# Patient Record
Sex: Male | Born: 2000 | Race: White | Hispanic: No | Marital: Single | Smoking: Never smoker
Health system: Southern US, Community
[De-identification: ages and names within clinical notes are randomized; demographics above are authoritative.]

## PROBLEM LIST (undated history)

## (undated) DIAGNOSIS — J45909 Unspecified asthma, uncomplicated: Secondary | ICD-10-CM

---
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
## 2020-01-05 IMAGING — CT CT HEAD W/O CM
3 series · 16 of 45 positions shown, 19 images · non-contrast
Comparison: None.

CLINICAL DATA: 17 y/o M; fall, found on floor, laceration to
forehead.

EXAM:
CT HEAD WITHOUT CONTRAST
TECHNIQUE: Contiguous axial images were obtained from the base of the skull
through the vertex without intravenous contrast.

[Series 3: head wo · axial · 0.41mm/px · z∈[-38,+77]mm · 10 of 28 slices shown, 13 images]
[im 3/28  brain]
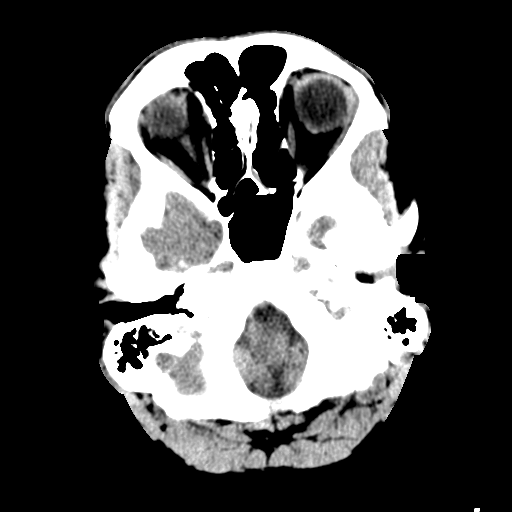
[im 3/28  bone]
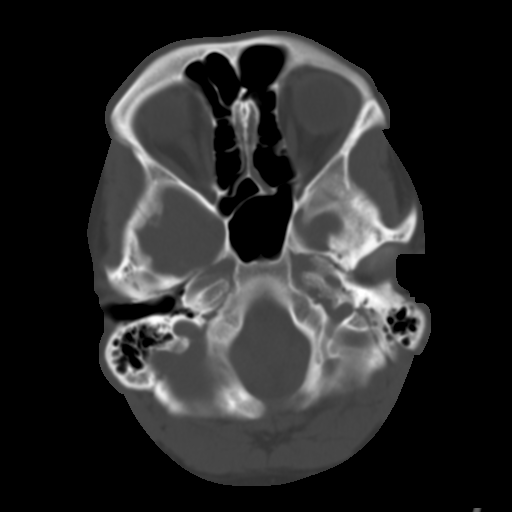
[im 5/28  brain]
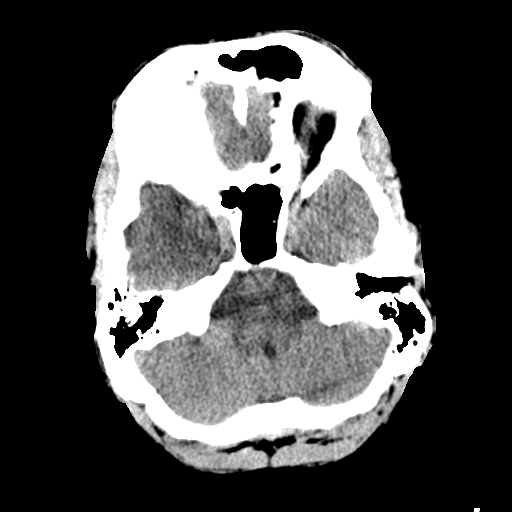
[im 8/28  brain]
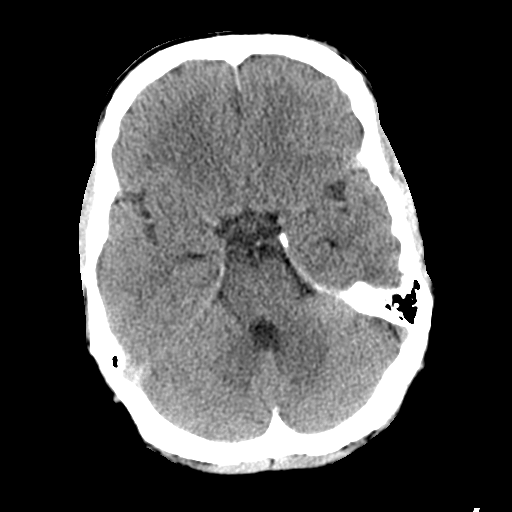
[im 11/28  brain]
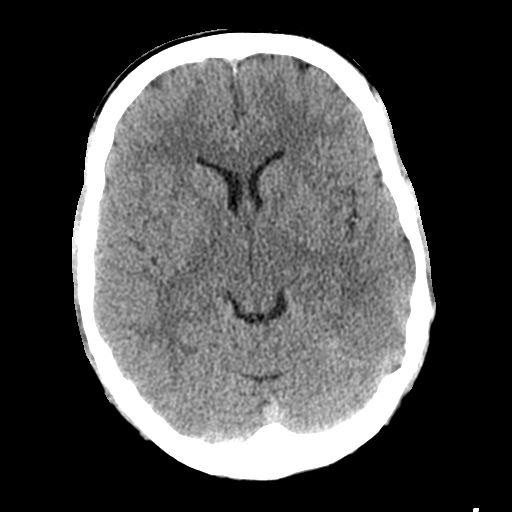
[im 13/28  brain]
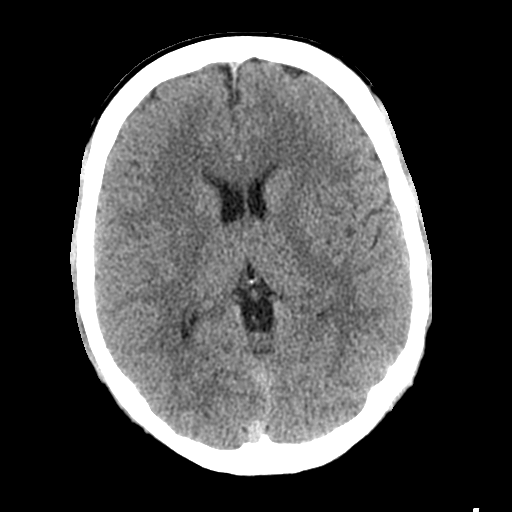
[im 13/28  bone]
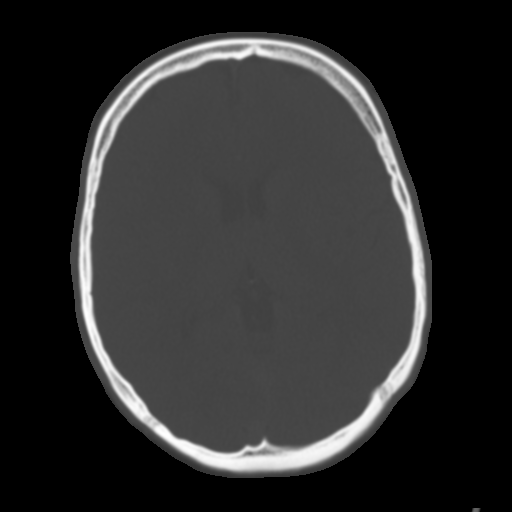
[im 16/28  brain]
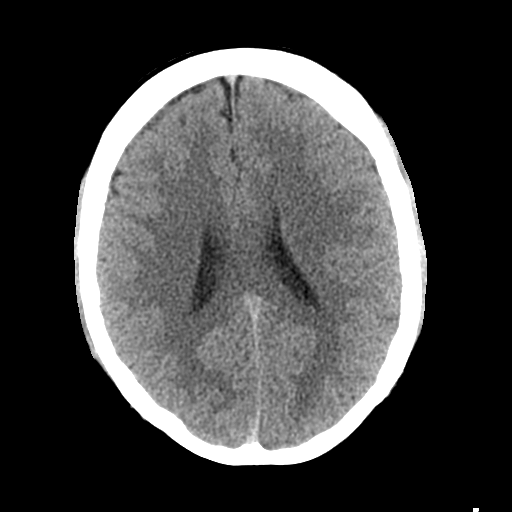
[im 18/28  brain]
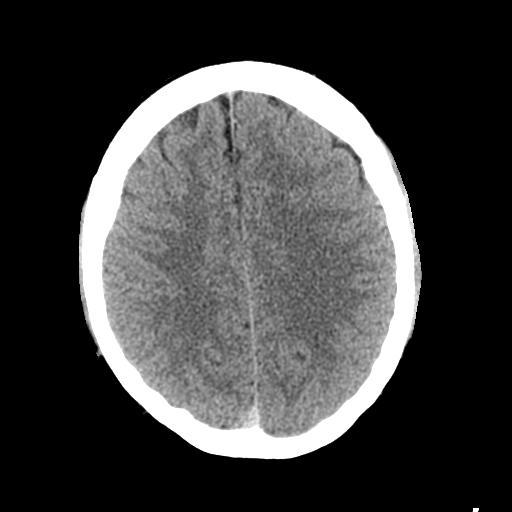
[im 21/28  brain]
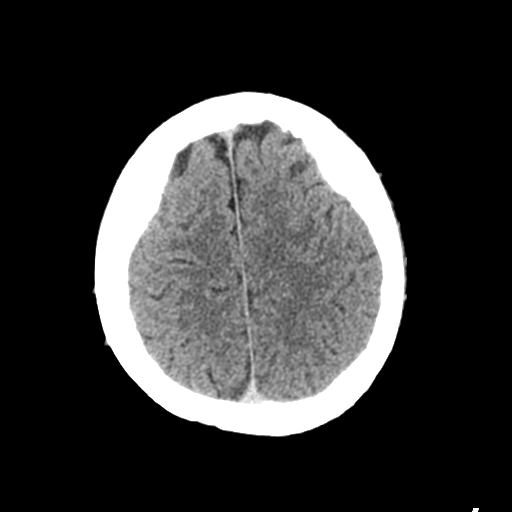
[im 24/28  brain]
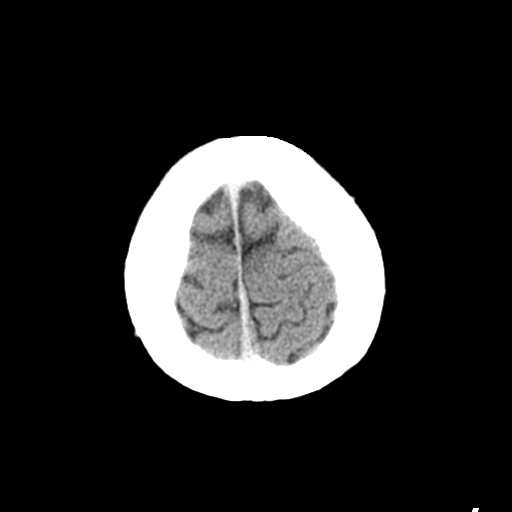
[im 24/28  bone]
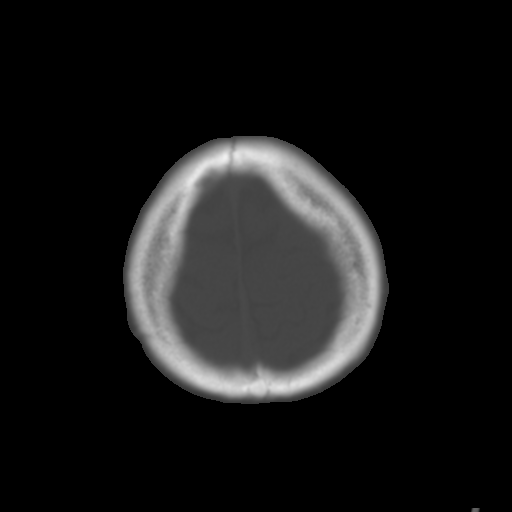
[im 26/28  brain]
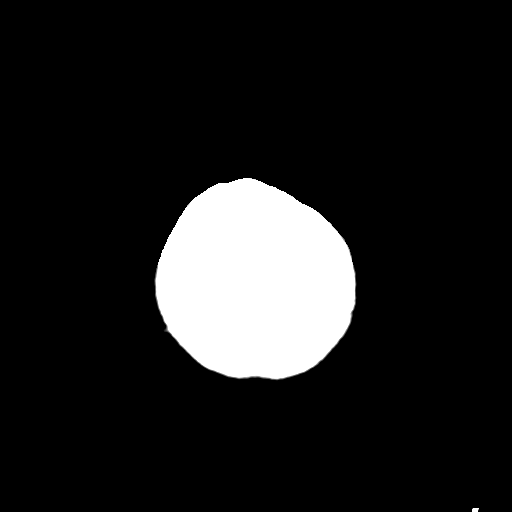

[Series 4: coronal soft tissue · coronal · 0.31mm/px · 3 of 59 slices shown]
[im 20/59  brain]
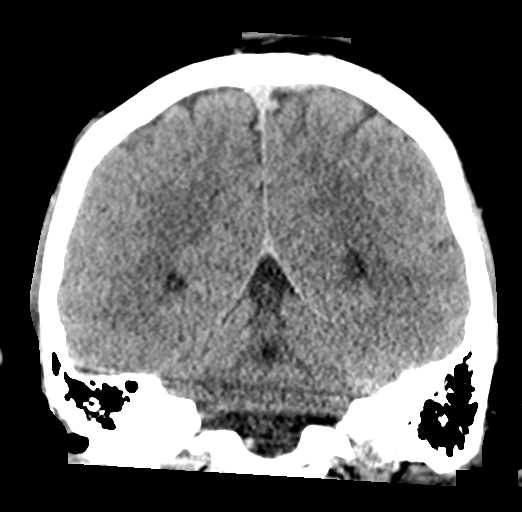
[im 26/59  brain]
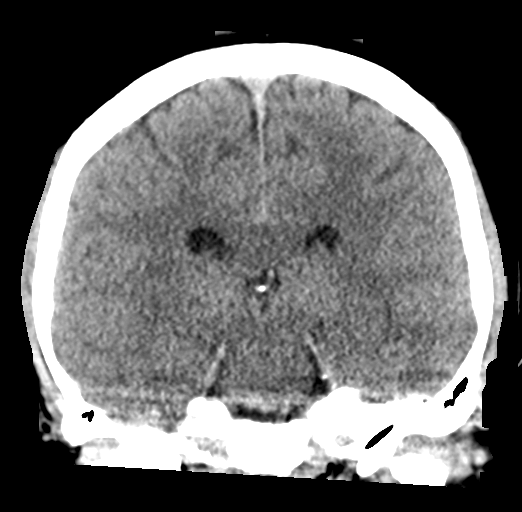
[im 33/59  brain]
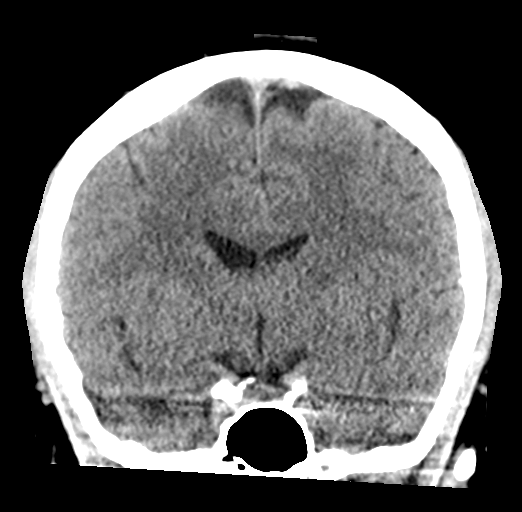

[Series 5: sagittal soft tissue · sagittal · 0.29mm/px · 3 of 53 slices shown]
[im 18/53  brain]
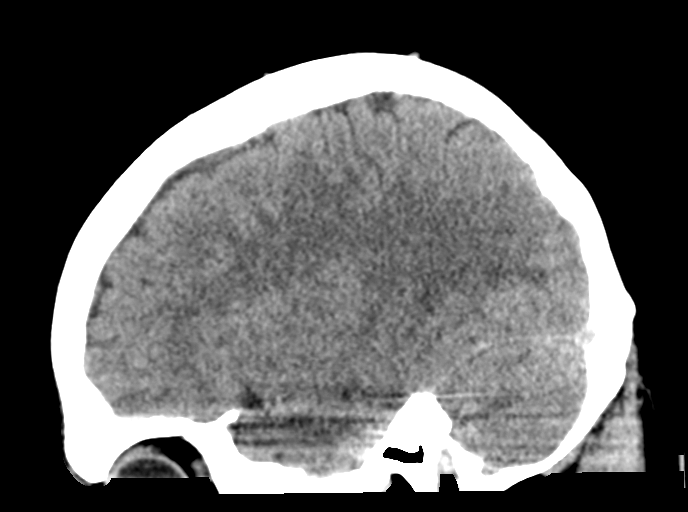
[im 27/53  brain]
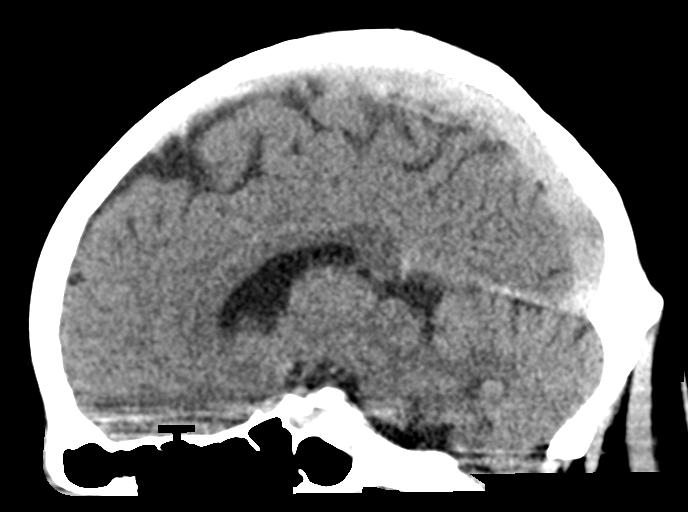
[im 35/53  brain]
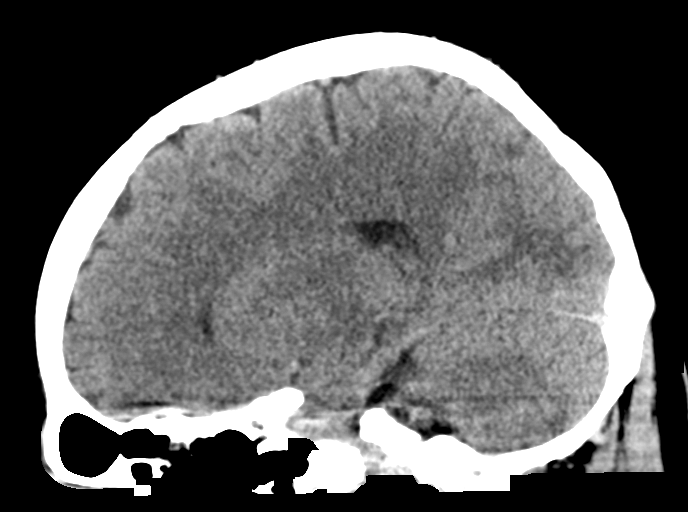

[16 of 45 positions shown; findings below may reference images not displayed]

FINDINGS: Brain: No evidence of acute infarction, hemorrhage, hydrocephalus,
extra-axial collection or mass lesion/mass effect.

Vascular: No hyperdense vessel or unexpected calcification.

Skull: Left frontal scalp and periorbital soft tissue contusion with
laceration. No fracture.

Sinuses/Orbits: No acute finding.

Other: None.
IMPRESSION: 1. Left frontal scalp and periorbital soft tissue contusion with
laceration. No calvaria fracture.
2. No acute intracranial abnormality.  Unremarkable CT of the brain.
